# Patient Record
Sex: Female | Born: 2000 | Race: White | Hispanic: No | Marital: Single | Smoking: Former smoker
Health system: Southern US, Community
[De-identification: ages and names within clinical notes are randomized; demographics above are authoritative.]

## PROBLEM LIST (undated history)

## (undated) HISTORY — PX: CHOLECYSTECTOMY: SHX55

## (undated) HISTORY — PX: APPENDECTOMY: SHX54

## (undated) HISTORY — PX: WISDOM TOOTH EXTRACTION: SHX21

---
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
## 2018-11-22 ENCOUNTER — Ambulatory Visit (INDEPENDENT_AMBULATORY_CARE_PROVIDER_SITE_OTHER): Payer: BC Managed Care – PPO | Admitting: Obstetrics & Gynecology

## 2018-11-22 ENCOUNTER — Encounter: Payer: Self-pay | Admitting: Obstetrics & Gynecology

## 2018-11-22 ENCOUNTER — Other Ambulatory Visit: Payer: Self-pay

## 2018-11-22 VITALS — BP 128/80 | Ht 67.0 in | Wt 194.0 lb

## 2018-11-22 DIAGNOSIS — Z113 Encounter for screening for infections with a predominantly sexual mode of transmission: Secondary | ICD-10-CM | POA: Diagnosis not present

## 2018-11-22 DIAGNOSIS — Z01419 Encounter for gynecological examination (general) (routine) without abnormal findings: Secondary | ICD-10-CM

## 2018-11-22 DIAGNOSIS — Z6830 Body mass index (BMI) 30.0-30.9, adult: Secondary | ICD-10-CM

## 2018-11-22 DIAGNOSIS — E6609 Other obesity due to excess calories: Secondary | ICD-10-CM | POA: Diagnosis not present

## 2018-11-22 DIAGNOSIS — Z30431 Encounter for routine checking of intrauterine contraceptive device: Secondary | ICD-10-CM

## 2018-11-22 NOTE — Progress Notes (Signed)
Janan Bogie Roesler February 09, 2001 [id number]   History:    18 y.o. G0 Single. [employer] [facility] in [employer]  RP:  Established patient presenting for annual gyn exam   HPI: Well on Mirena IUD x 11/2017.  No breakthrough bleeding.  No pelvic pain.  No pain with intercourse.  No abnormal discharge.  Urine and bowel movements normal.  Breast normal.  Body mass index 30.38.  Needs to increase physical activity.  Past medical history,surgical history, family history and social history were all reviewed and documented in the EPIC chart.  Gynecologic History No LMP recorded (lmp unknown). (Menstrual status: IUD). Contraception: Mirena IUD x 11/2017 Last Pap: Never Last mammogram: Never Bone Density: Never Colonoscopy: Never  Obstetric History OB History  Gravida Para Term Preterm AB Living  0 0 0 0 0 0  SAB TAB Ectopic Multiple Live Births  0 0 0 0 0     ROS: A ROS was performed and pertinent positives and negatives are included in the history.  GENERAL: No fevers or chills. HEENT: No change in vision, no earache, sore throat or sinus congestion. NECK: No pain or stiffness. CARDIOVASCULAR: No chest pain or pressure. No palpitations. PULMONARY: No shortness of breath, cough or wheeze. GASTROINTESTINAL: No abdominal pain, nausea, vomiting or diarrhea, melena or bright red blood per rectum. GENITOURINARY: No urinary frequency, urgency, hesitancy or dysuria. MUSCULOSKELETAL: No joint or muscle pain, no back pain, no recent trauma. DERMATOLOGIC: No rash, no itching, no lesions. ENDOCRINE: No polyuria, polydipsia, no heat or cold intolerance. No recent change in weight. HEMATOLOGICAL: No anemia or easy bruising or bleeding. NEUROLOGIC: No headache, seizures, numbness, tingling or weakness. PSYCHIATRIC: No depression, no loss of interest in normal activity or change in sleep pattern.     Exam:   BP 128/80 (BP Location: Right Arm, Patient Position: Sitting, Cuff Size:  Normal)   Ht 5\' 7"  (1.702 m)   Wt 194 lb (88 kg)   LMP  (LMP Unknown)   BMI 30.38 kg/m   Body mass index is 30.38 kg/m.  General appearance : Well developed well nourished female. No acute distress HEENT: Eyes: no retinal hemorrhage or exudates,  Neck supple, trachea midline, no carotid bruits, no thyroidmegaly Lungs: Clear to auscultation, no rhonchi or wheezes, or rib retractions  Heart: Regular rate and rhythm, no murmurs or gallops Breast:Examined in sitting and supine position were symmetrical in appearance, no palpable masses or tenderness,  no skin retraction, no nipple inversion, no nipple discharge, no skin discoloration, no axillary or supraclavicular lymphadenopathy Abdomen: no palpable masses or tenderness, no rebound or guarding Extremities: no edema or skin discoloration or tenderness  Pelvic: Vulva: Normal             Vagina: No gross lesions or discharge  Cervix: No gross lesions or discharge.  IUD strings visible at EO  Uterus  AV, normal size, shape and consistency, non-tender and mobile  Adnexa  Without masses or tenderness  Anus: Normal   Assessment/Plan:  18 y.o. female for annual exam   1. Well female exam with routine gynecological exam Normal gynecologic exam.  Breast exam normal.  2. Encounter for routine checking of intrauterine contraceptive device (IUD) Mirena IUD well-tolerated and in good position.  Insertion was June 2019.  3. Screen for STD (sexually transmitted disease) Gonorrhea and chlamydia done.  Declines the rest of the STD screening. - C. trachomatis/N. gonorrhoeae RNA  4. Class 1 obesity due to excess calories without  serious comorbidity with body mass index (BMI) of 30.0 to 30.9 in adult Recommend a lower calorie/carb diet such as [employer] diet.  Aerobic activities 5 times a week and weightlifting every 2 days.  Other orders - methylphenidate (RITALIN LA) 30 MG 24 hr capsule; Take 1 capsule by mouth daily. - busPIRone (BUSPAR) 10  MG tablet; Take 1 tablet by mouth daily. - levonorgestrel (MIRENA) 20 MCG/24HR IUD; 1 each by Intrauterine route once.  Genia DelMarie-Lyne Estiven Kohan MD, 4:16 PM 11/22/2018

## 2018-11-23 LAB — C. TRACHOMATIS/N. GONORRHOEAE RNA
C. trachomatis RNA, TMA: NOT DETECTED
N. gonorrhoeae RNA, TMA: NOT DETECTED

## 2018-11-29 ENCOUNTER — Encounter: Payer: Self-pay | Admitting: Obstetrics & Gynecology

## 2018-11-29 NOTE — Patient Instructions (Signed)
1. Well female exam with routine gynecological exam Normal gynecologic exam.  Breast exam normal.  2. Encounter for routine checking of intrauterine contraceptive device (IUD) Mirena IUD well-tolerated and in good position.  Insertion was June 2019.  3. Screen for STD (sexually transmitted disease) Gonorrhea and chlamydia done.  Declines the rest of the STD screening. - C. trachomatis/N. gonorrhoeae RNA  4. Class 1 obesity due to excess calories without serious comorbidity with body mass index (BMI) of 30.0 to 30.9 in adult Recommend a lower calorie/carb diet such as [employer].  Aerobic activities 5 times a week and weightlifting every 2 days.  Other orders - methylphenidate (RITALIN LA) 30 MG 24 hr capsule; Take 1 capsule by mouth daily. - busPIRone (BUSPAR) 10 MG tablet; Take 1 tablet by mouth daily. - levonorgestrel (MIRENA) 20 MCG/24HR IUD; 1 each by Intrauterine route once.  Zipporah, it was a pleasure seeing you today!  I will inform you of your results as soon as they are available.

## 2019-02-14 ENCOUNTER — Other Ambulatory Visit: Payer: Self-pay | Admitting: Gastroenterology

## 2019-02-14 ENCOUNTER — Other Ambulatory Visit (HOSPITAL_COMMUNITY): Payer: Self-pay | Admitting: Gastroenterology

## 2019-02-14 DIAGNOSIS — R110 Nausea: Secondary | ICD-10-CM

## 2019-03-17 ENCOUNTER — Other Ambulatory Visit: Payer: Self-pay

## 2019-03-17 ENCOUNTER — Ambulatory Visit (HOSPITAL_COMMUNITY)
Admission: RE | Admit: 2019-03-17 | Discharge: 2019-03-17 | Disposition: A | Discharge status: Home / Self Care | Payer: BC Managed Care – PPO | Source: Ambulatory Visit | Attending: Gastroenterology | Admitting: Gastroenterology

## 2019-03-17 ENCOUNTER — Encounter (HOSPITAL_COMMUNITY): Payer: Self-pay

## 2019-03-17 ENCOUNTER — Encounter (HOSPITAL_COMMUNITY)
Admission: RE | Admit: 2019-03-17 | Discharge: 2019-03-17 | Disposition: A | Discharge status: Home / Self Care | Payer: BC Managed Care – PPO | Source: Ambulatory Visit | Attending: Gastroenterology | Admitting: Gastroenterology

## 2019-03-17 DIAGNOSIS — R110 Nausea: Secondary | ICD-10-CM | POA: Diagnosis not present

## 2019-03-25 ENCOUNTER — Other Ambulatory Visit: Payer: Self-pay | Admitting: Surgery

## 2019-08-26 ENCOUNTER — Ambulatory Visit: Payer: BC Managed Care – PPO

## 2019-08-27 ENCOUNTER — Ambulatory Visit: Payer: BC Managed Care – PPO

## 2019-09-09 ENCOUNTER — Ambulatory Visit: Payer: BC Managed Care – PPO

## 2019-09-16 ENCOUNTER — Ambulatory Visit: Payer: BC Managed Care – PPO | Attending: Sports Medicine

## 2019-09-18 ENCOUNTER — Ambulatory Visit: Payer: BC Managed Care – PPO

## 2019-09-23 ENCOUNTER — Ambulatory Visit: Payer: BC Managed Care – PPO

## 2019-10-28 ENCOUNTER — Other Ambulatory Visit: Payer: Self-pay | Admitting: Physician Assistant

## 2019-10-28 DIAGNOSIS — R222 Localized swelling, mass and lump, trunk: Secondary | ICD-10-CM

## 2019-10-29 ENCOUNTER — Other Ambulatory Visit: Payer: BC Managed Care – PPO

## 2019-11-04 ENCOUNTER — Other Ambulatory Visit: Payer: BC Managed Care – PPO

## 2020-01-20 ENCOUNTER — Other Ambulatory Visit: Payer: Self-pay

## 2020-01-20 ENCOUNTER — Encounter: Payer: Self-pay | Admitting: Nurse Practitioner

## 2020-01-20 ENCOUNTER — Ambulatory Visit: Payer: BC Managed Care – PPO | Admitting: Nurse Practitioner

## 2020-01-20 VITALS — BP 118/76 | Ht 68.0 in | Wt 206.0 lb

## 2020-01-20 DIAGNOSIS — Z01419 Encounter for gynecological examination (general) (routine) without abnormal findings: Secondary | ICD-10-CM

## 2020-01-20 DIAGNOSIS — Z975 Presence of (intrauterine) contraceptive device: Secondary | ICD-10-CM | POA: Diagnosis not present

## 2020-01-20 DIAGNOSIS — Z124 Encounter for screening for malignant neoplasm of cervix: Secondary | ICD-10-CM | POA: Diagnosis not present

## 2020-01-20 NOTE — Progress Notes (Signed)
   Helen Townsend [birth year] [id number]   History:  19 y.o. G0 presents for annual exam without GYN complaints.  Amenorrheic/Mirena IUD placed June 2019.  Gardasil series completed.  Not currently sexually active.  Gynecologic History No LMP recorded. (Menstrual status: IUD).   Contraception: IUD Last Pap: never  Past medical history, past surgical history, family history and social history were all reviewed and documented in the EPIC chart.  ROS:  A ROS was performed and pertinent positives and negatives are included.  Exam:  Vitals:   01/20/20 1052  BP: 118/76  Weight: 206 lb (93.4 kg)  Height: 5\' 8"  (1.727 m)   Body mass index is 31.32 kg/m.  General appearance:  Normal Thyroid:  Symmetrical, normal in size, without palpable masses or nodularity. Respiratory  Auscultation:  Clear without wheezing or rhonchi Cardiovascular  Auscultation:  Regular rate, without rubs, murmurs or gallops  Edema/varicosities:  Not grossly evident Abdominal  Soft,nontender, without masses, guarding or rebound.  Liver/spleen:  No organomegaly noted  Hernia:  None appreciated  Skin  Inspection:  Grossly normal   Breasts: Examined lying and sitting.   Right: Without masses, retractions, discharge or axillary adenopathy.   Left: Without masses, retractions, discharge or axillary adenopathy. Gentitourinary   Inguinal/mons:  Normal without inguinal adenopathy  External genitalia:  Normal  BUS/Urethra/Skene's glands:  Normal  Vagina:  Normal  Cervix:  Normal, IUD string visible in os  Uterus:  Anteverted, normal in size, shape and contour.  Midline and mobile  Adnexa/parametria:     Rt: Without masses or tenderness.   Lt: Without masses or tenderness.  Anus and perineum: Normal  Assessment/Plan:  19 y.o. G0 for annual exam.   Well female exam with routine gynecological exam - Education provided on SBEs, importance of preventative screenings, current guidelines, high calcium diet, regular  exercise, and safe sex practices.   Papanicolaou smear for cervical cancer screening - pap today. Educated on current guidelines and importance of screenings.  IUD (intrauterine device) in place -IUD string visible on exam.  Amenorrheic and doing well on this.  Follow-up in 1 year for annual      [facility], 11:14 AM 01/20/2020

## 2020-01-20 NOTE — Addendum Note (Signed)
Addended by: Dayna Barker on: 01/20/2020 11:44 AM   Modules accepted: Orders

## 2020-01-20 NOTE — Patient Instructions (Signed)
Health Maintenance, Female Adopting a healthy lifestyle and getting preventive care are important in promoting health and wellness. Ask your health care provider about:  The right schedule for you to have regular tests and exams.  Things you can do on your own to prevent diseases and keep yourself healthy. What should I know about diet, weight, and exercise? Eat a healthy diet   Eat a diet that includes plenty of vegetables, fruits, low-fat dairy products, and lean protein.  Do not eat a lot of foods that are high in solid fats, added sugars, or sodium. Maintain a healthy weight Body mass index (BMI) is used to identify weight problems. It estimates body fat based on height and weight. Your health care provider can help determine your BMI and help you achieve or maintain a healthy weight. Get regular exercise Get regular exercise. This is one of the most important things you can do for your health. Most adults should:  Exercise for at least 150 minutes each week. The exercise should increase your heart rate and make you sweat (moderate-intensity exercise).  Do strengthening exercises at least twice a week. This is in addition to the moderate-intensity exercise.  Spend less time sitting. Even light physical activity can be beneficial. Watch cholesterol and blood lipids Have your blood tested for lipids and cholesterol at 20 years of age, then have this test every 5 years. Have your cholesterol levels checked more often if:  Your lipid or cholesterol levels are high.  You are older than 21 years of age.  You are at high risk for heart disease. What should I know about cancer screening? Depending on your health history and family history, you may need to have cancer screening at various ages. This may include screening for:  Breast cancer.  Cervical cancer.  Colorectal cancer.  Skin cancer.  Lung cancer. What should I know about heart disease, diabetes, and high blood  pressure? Blood pressure and heart disease  High blood pressure causes heart disease and increases the risk of stroke. This is more likely to develop in people who have high blood pressure readings, are of African descent, or are overweight.  Have your blood pressure checked: ? Every 3-5 years if you are 18-39 years of age. ? Every year if you are 40 years old or older. Diabetes Have regular diabetes screenings. This checks your fasting blood sugar level. Have the screening done:  Once every three years after age 40 if you are at a normal weight and have a low risk for diabetes.  More often and at a younger age if you are overweight or have a high risk for diabetes. What should I know about preventing infection? Hepatitis B If you have a higher risk for hepatitis B, you should be screened for this virus. Talk with your health care provider to find out if you are at risk for hepatitis B infection. Hepatitis C Testing is recommended for:  Everyone born from 1945 through 1965.  Anyone with known risk factors for hepatitis C. Sexually transmitted infections (STIs)  Get screened for STIs, including gonorrhea and chlamydia, if: ? You are sexually active and are younger than 21 years of age. ? You are older than 21 years of age and your health care provider tells you that you are at risk for this type of infection. ? Your sexual activity has changed since you were last screened, and you are at increased risk for chlamydia or gonorrhea. Ask your health care provider if   you are at risk.  Ask your health care provider about whether you are at high risk for HIV. Your health care provider may recommend a prescription medicine to help prevent HIV infection. If you choose to take medicine to prevent HIV, you should first get tested for HIV. You should then be tested every 3 months for as long as you are taking the medicine. Pregnancy  If you are about to stop having your period (premenopausal) and  you may become pregnant, seek counseling before you get pregnant.  Take 400 to 800 micrograms (mcg) of folic acid every day if you become pregnant.  Ask for birth control (contraception) if you want to prevent pregnancy. Osteoporosis and menopause Osteoporosis is a disease in which the bones lose minerals and strength with aging. This can result in bone fractures. If you are 65 years old or older, or if you are at risk for osteoporosis and fractures, ask your health care provider if you should:  Be screened for bone loss.  Take a calcium or vitamin D supplement to lower your risk of fractures.  Be given hormone replacement therapy (HRT) to treat symptoms of menopause. Follow these instructions at home: Lifestyle  Do not use any products that contain nicotine or tobacco, such as cigarettes, e-cigarettes, and chewing tobacco. If you need help quitting, ask your health care provider.  Do not use street drugs.  Do not share needles.  Ask your health care provider for help if you need support or information about quitting drugs. Alcohol use  Do not drink alcohol if: ? Your health care provider tells you not to drink. ? You are pregnant, may be pregnant, or are planning to become pregnant.  If you drink alcohol: ? Limit how much you use to 0-1 drink a day. ? Limit intake if you are breastfeeding.  Be aware of how much alcohol is in your drink. In the [place] one drink equals one 12 oz bottle of beer (355 mL), one 5 oz glass of wine (148 mL), or one 1 oz glass of hard liquor (44 mL). General instructions  Schedule regular health, dental, and eye exams.  Stay current with your vaccines.  Tell your health care provider if: ? You often feel depressed. ? You have ever been abused or do not feel safe at home. Summary  Adopting a healthy lifestyle and getting preventive care are important in promoting health and wellness.  Follow your health care provider's instructions about healthy  diet, exercising, and getting tested or screened for diseases.  Follow your health care provider's instructions on monitoring your cholesterol and blood pressure. This information is not intended to replace advice given to you by your health care provider. Make sure you discuss any questions you have with your health care provider. Document Revised: 05/22/2018 Document Reviewed: 05/22/2018 Elsevier Patient Education  2020 [employer]  

## 2020-01-21 LAB — PAP IG W/ RFLX HPV ASCU

## 2020-07-27 IMAGING — US US ABDOMEN LIMITED
1 series · 14 of 25 positions shown · non-contrast
Comparison: CT examination of 07/28/2009.

CLINICAL DATA: Nausea, right upper quadrant pain

EXAM:
ULTRASOUND ABDOMEN LIMITED RIGHT UPPER QUADRANT

[Series 1: us abdomen limited · 14 of 32 slices shown]
[im 1/32]
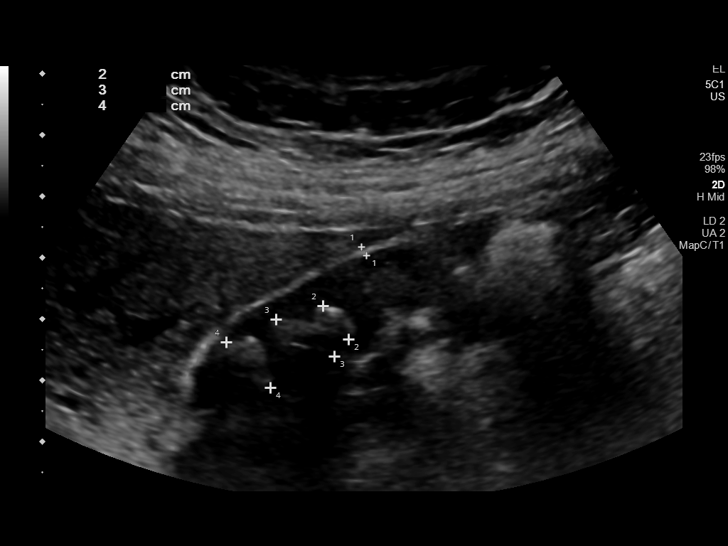
[im 3/32]
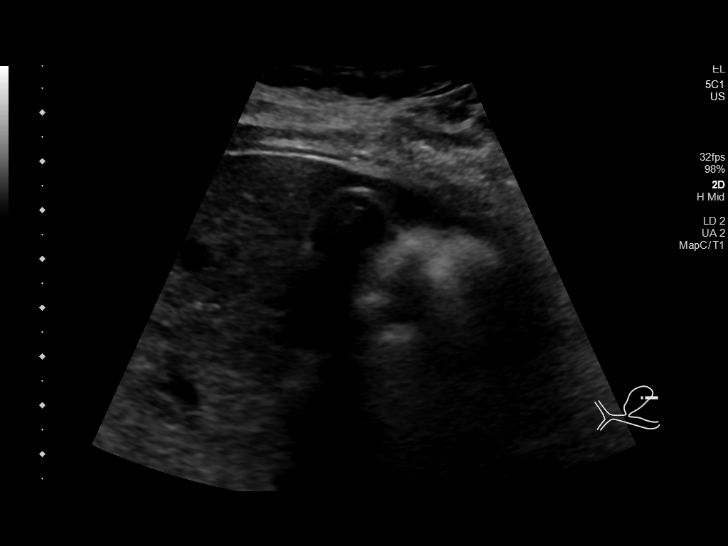
[im 6/32]
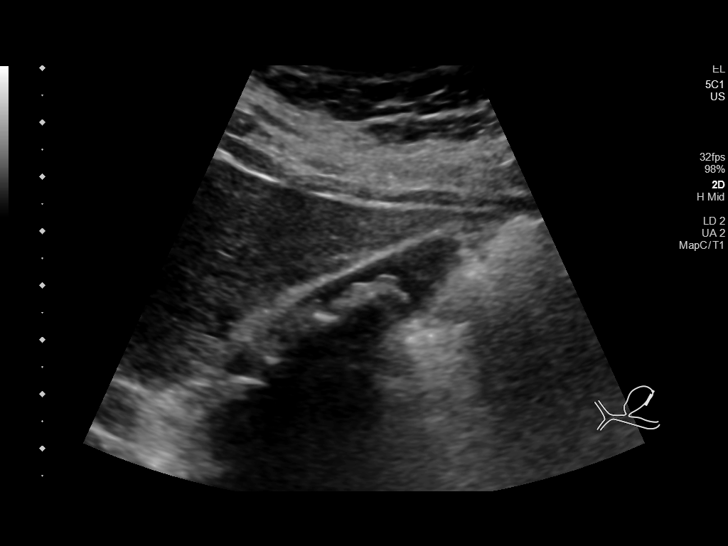
[im 8/32]
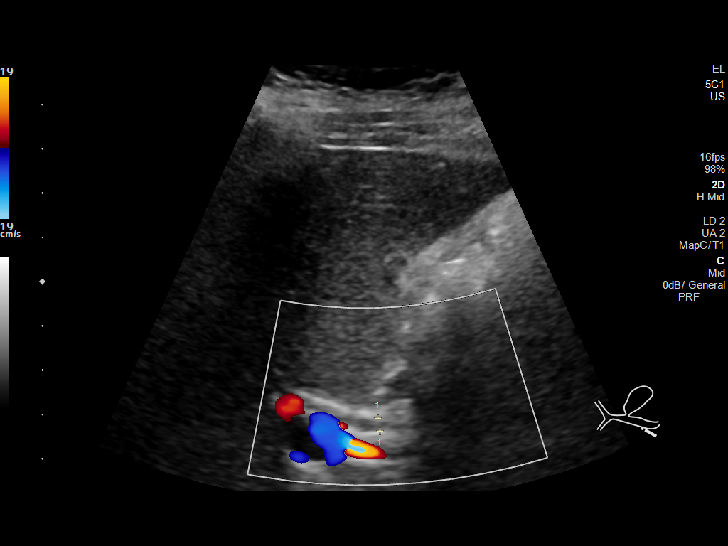
[im 11/32]
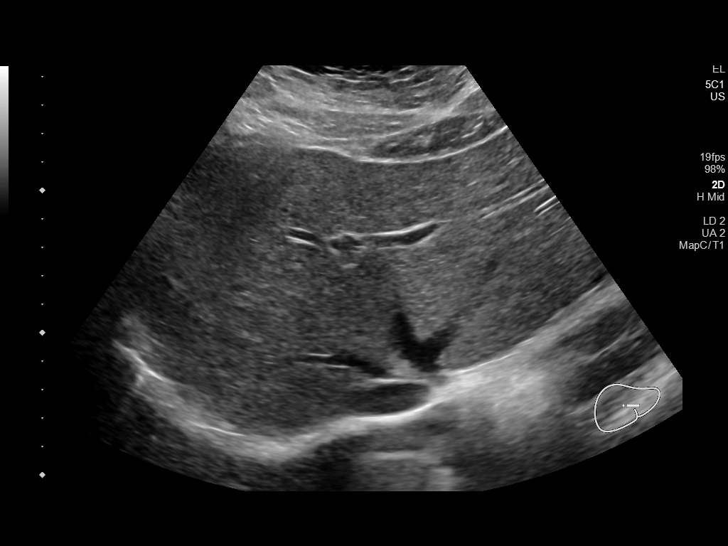
[im 12/32]
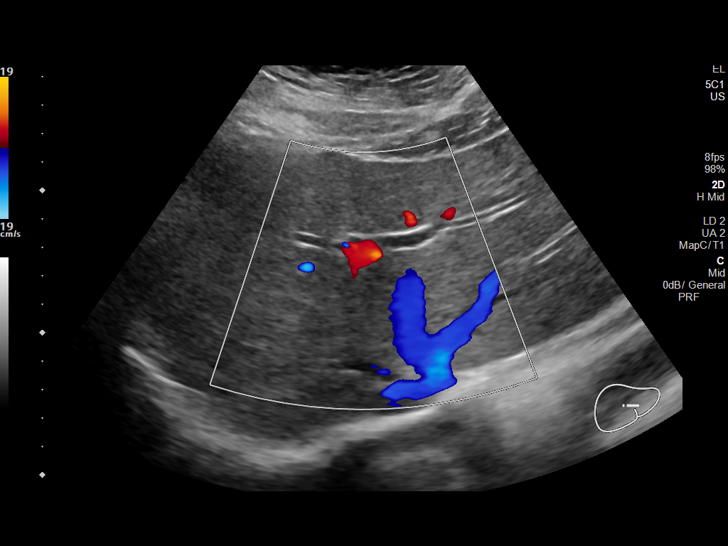
[im 15/32]
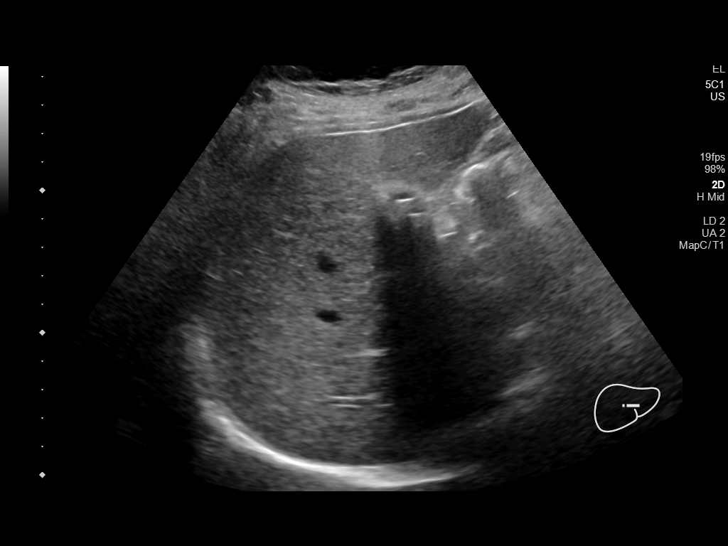
[im 17/32]
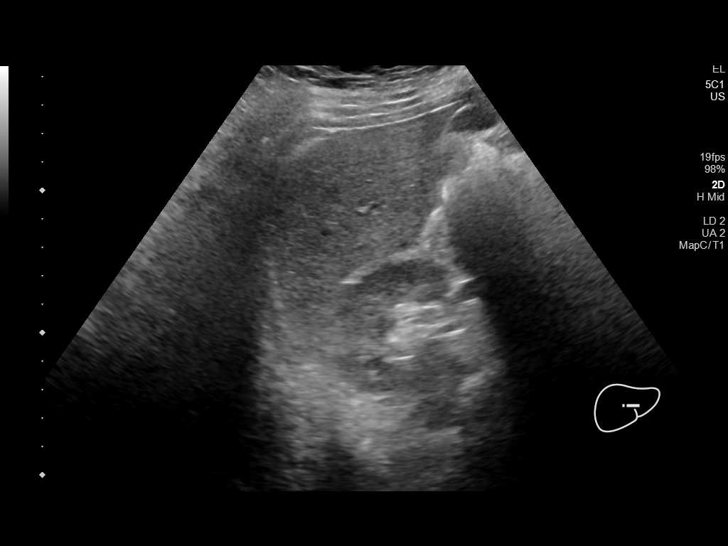
[im 20/32]
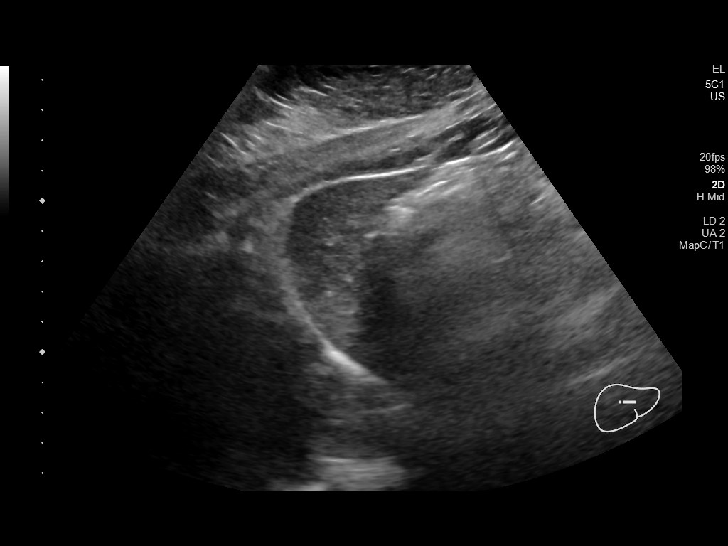
[im 21/32]
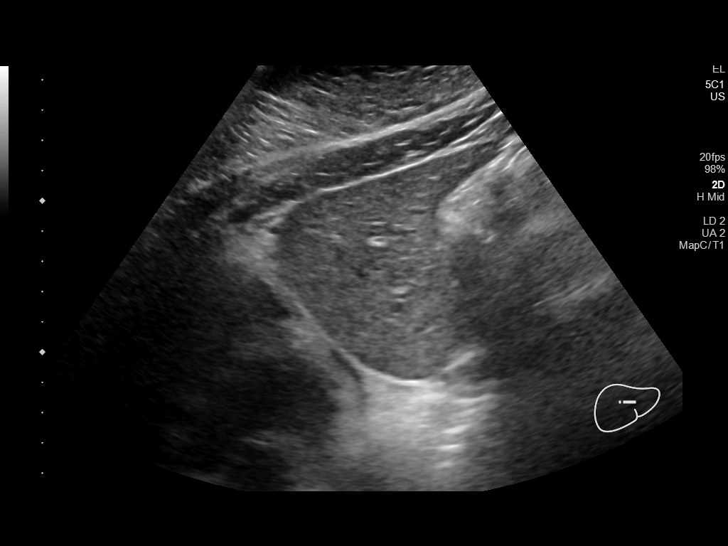
[im 24/32]
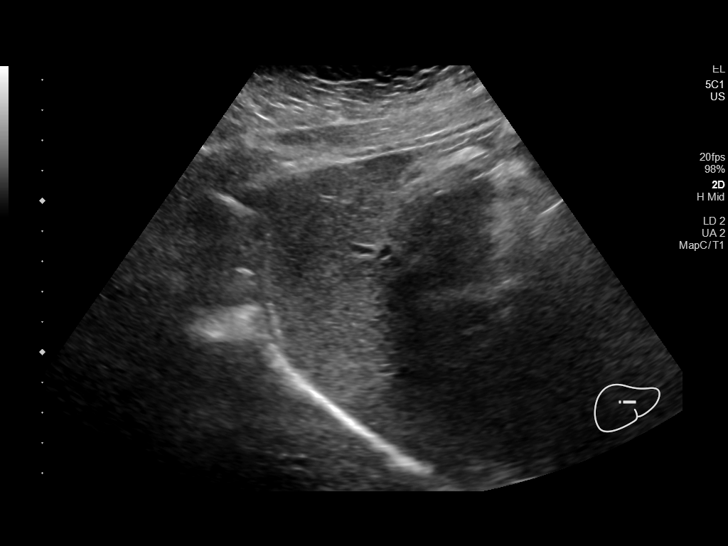
[im 26/32]
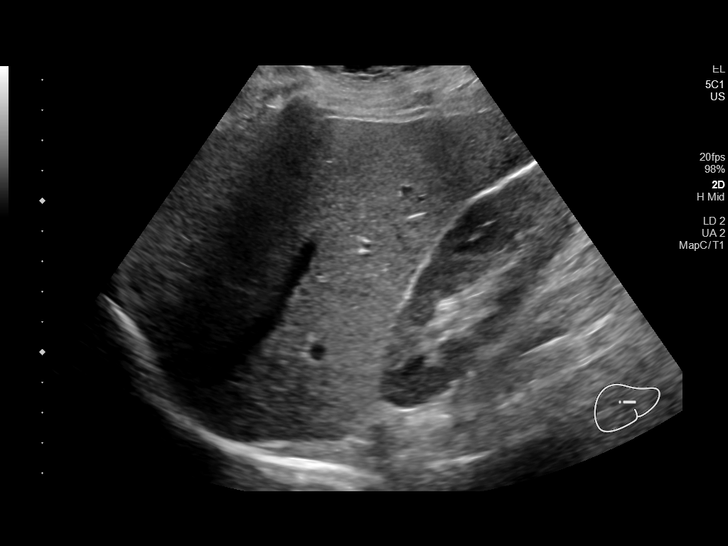
[im 29/32]
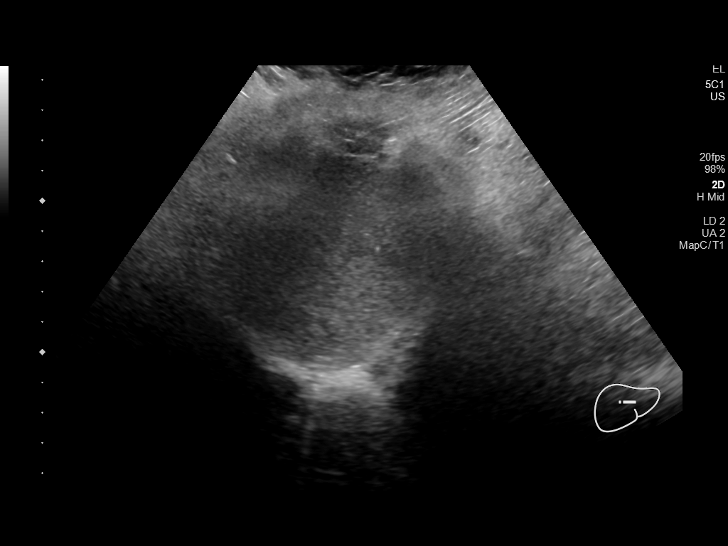
[im 32/32]
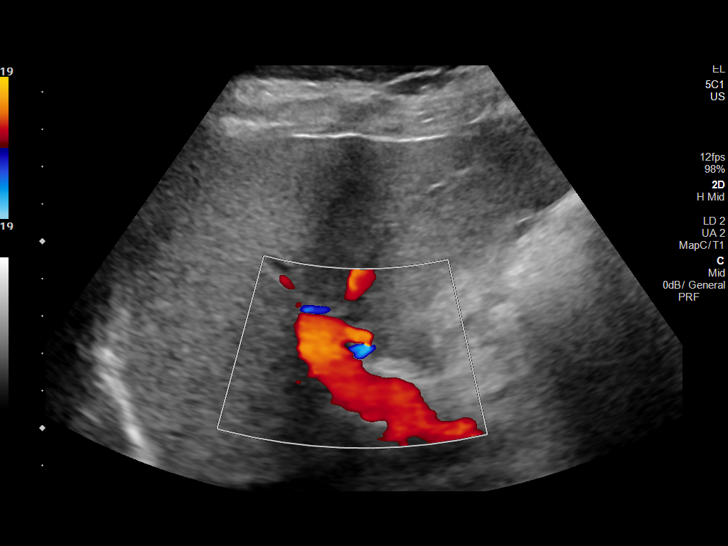

[14 of 25 positions shown; findings below may reference images not displayed]

FINDINGS: Gallbladder:

Numerous gallstones are visualized in the gallbladder lumen. There
is a positive wall echo shadow sign and reported positive
sonographic Ceejay. The wall is not thickened and there is no
visible pericholecystic fluid. Calculi are in the range of 0.7-1.1
cm, at least 4 large calculi are visualized.

Common bile duct:

Diameter: 3 mm

Liver:

No focal lesion identified. Within normal limits in parenchymal
echogenicity. Portal vein is patent on color Doppler imaging with
normal direction of blood flow towards the liver.

Other: None.
IMPRESSION: Positive wall echo shadow sign (cholelithiasis) and reported
sonographic Ceejay, findings could be seen in the setting of early
acute cholecystitis. Correlation with HIDA scan may be helpful. No
wall thickening or pericholecystic fluid is noted.

## 2020-12-02 ENCOUNTER — Ambulatory Visit: Payer: BC Managed Care – PPO | Admitting: Nurse Practitioner

## 2020-12-02 ENCOUNTER — Other Ambulatory Visit: Payer: Self-pay

## 2020-12-02 VITALS — BP 130/82 | HR 88 | Ht 68.0 in | Wt 206.0 lb

## 2020-12-02 DIAGNOSIS — Z30431 Encounter for routine checking of intrauterine contraceptive device: Secondary | ICD-10-CM

## 2020-12-02 NOTE — Progress Notes (Signed)
   Acute Office Visit  Subjective:    Patient ID: Helen Townsend, female    DOB: 2000-08-13, 20 y.o.   MRN: [id number]   HPI 20 y.o. presents today for IUD check up. Mirena inserted 11/2017. She had mild intermittent abdominal pain that occurred 1-2 days after intercourse. Denies vaginal bleeding. Pain has improved and she thinks it may have been constipation.    Review of Systems  Constitutional: Negative.   Gastrointestinal:  Positive for abdominal pain.  Genitourinary: Negative.       Objective:    Physical Exam Constitutional:      Appearance: Normal appearance.  Genitourinary:    General: Normal vulva.     Vagina: Vaginal discharge present. No erythema.     Cervix: Normal.     Comments: IUD string visible about 1 cm at endocervix   BP 130/82   Pulse 88   Ht 5\' 8"  (1.727 m)   Wt 206 lb (93.4 kg)   SpO2 100%   BMI 31.32 kg/m  Wt Readings from Last 3 Encounters:  12/02/20 206 lb (93.4 kg)  01/20/20 206 lb (93.4 kg)  11/22/18 194 lb (88 kg) (97 %, Z= 1.84)*   * Growth percentiles are based on CDC (Girls, 2-20 Years) data.        Assessment & Plan:   Problem List Items Addressed This Visit   None Visit Diagnoses     IUD check up    -  Primary      Plan: IUD string visible and reassurance provided on normal positioning. She will return to office if pain returns. She is agreeable to plan.      01/22/19 DNP, 10:23 AM 12/02/2020

## 2022-08-17 ENCOUNTER — Encounter: Payer: Self-pay | Admitting: Otolaryngology
